# Patient Record
Sex: Male | Born: 1975 | Race: White | Hispanic: No | Marital: Married | Smoking: Never smoker
Health system: Southern US, Community
[De-identification: ages and names within clinical notes are randomized; demographics above are authoritative.]

## PROBLEM LIST (undated history)

## (undated) DIAGNOSIS — R42 Dizziness and giddiness: Secondary | ICD-10-CM

## (undated) DIAGNOSIS — E785 Hyperlipidemia, unspecified: Secondary | ICD-10-CM

## (undated) DIAGNOSIS — I10 Essential (primary) hypertension: Secondary | ICD-10-CM

## (undated) DIAGNOSIS — R079 Chest pain, unspecified: Secondary | ICD-10-CM

## (undated) DIAGNOSIS — I493 Ventricular premature depolarization: Secondary | ICD-10-CM

## (undated) DIAGNOSIS — R002 Palpitations: Secondary | ICD-10-CM

## (undated) HISTORY — DX: Chest pain, unspecified: R07.9

## (undated) HISTORY — DX: Hyperlipidemia, unspecified: E78.5

## (undated) HISTORY — DX: Dizziness and giddiness: R42

## (undated) HISTORY — DX: Palpitations: R00.2

## (undated) HISTORY — DX: Ventricular premature depolarization: I49.3

## (undated) HISTORY — DX: Essential (primary) hypertension: I10

---

## 2015-07-17 ENCOUNTER — Ambulatory Visit: Payer: Self-pay | Admitting: Physician Assistant

## 2015-08-12 DIAGNOSIS — R42 Dizziness and giddiness: Secondary | ICD-10-CM

## 2015-08-12 DIAGNOSIS — R079 Chest pain, unspecified: Secondary | ICD-10-CM

## 2015-08-12 DIAGNOSIS — E785 Hyperlipidemia, unspecified: Secondary | ICD-10-CM

## 2015-08-12 DIAGNOSIS — I493 Ventricular premature depolarization: Secondary | ICD-10-CM

## 2015-08-12 DIAGNOSIS — R002 Palpitations: Secondary | ICD-10-CM

## 2015-08-12 DIAGNOSIS — I10 Essential (primary) hypertension: Secondary | ICD-10-CM

## 2015-08-12 HISTORY — DX: Essential (primary) hypertension: I10

## 2015-08-12 HISTORY — DX: Ventricular premature depolarization: I49.3

## 2015-08-12 HISTORY — DX: Palpitations: R00.2

## 2015-08-12 HISTORY — DX: Dizziness and giddiness: R42

## 2015-08-13 ENCOUNTER — Encounter: Payer: Self-pay | Admitting: Cardiovascular Disease

## 2015-08-13 NOTE — Progress Notes (Signed)
Patient ID: Jordan Colon, male   DOB: 03-16-1976, 39 y.o.   MRN: [id number]     Cardiology Office Note   Date:  08/15/2015   ID:  Jordan Colon, DOB Dec 04, 1975, MRN [id number]  PCP:  Lindwood Qua, MD  Cardiologist:   Charlton Haws, MD   No chief complaint on file.     History of Present Illness: Jordan Colon is a 39 y.o. male who presents for risk factor modification.  Reviewed over 50 pages of records from [facility].  Dr Rhoderick Moody.  Family history of premature CAD with father having MI age 54.  Rx for HTN  And elevated lipids.  History of abnormal ECG with normal stress echo in 2007.  Last seen in [facility] 02/21/14.  Nonsmoker non diabetic.  History of palpitations benign responded to lower caffeine and beta blocker.  He is moderately overweight at 250 lbs  ECG 02/21/14 reviewed and nonspecific inferolateral ST segment changes. Holter in 2015 wit PVC;s no NSVT.      Past Medical History  Diagnosis Date  . Hypertension   . Hyperlipidemia   . Chest pain   . Essential hypertension 08/12/2015  . Palpitations 08/12/2015  . Dizziness 08/12/2015  . PVC's (premature ventricular contractions) 08/12/2015    History reviewed. No pertinent past surgical history.   Current Outpatient Prescriptions  Medication Sig Dispense Refill  . carvedilol (COREG) 12.5 MG tablet Take 12.5 mg by mouth daily.    Marland Kitchen CETIRIZINE HCL PO Take 1 tablet by mouth daily.    Marland Kitchen losartan (COZAAR) 100 MG tablet Take 100 mg by mouth daily.    . OMEGA-3 FATTY ACIDS-VITAMIN E PO Take 1,000 mg by mouth daily.    . pravastatin (PRAVACHOL) 40 MG tablet Take 40 mg by mouth daily.     No current facility-administered medications for this visit.    Allergies:   Review of patient's allergies indicates no known allergies.    Social History:  The patient  reports that he has never smoked. He has never used smokeless tobacco. He reports that he does not drink alcohol or use illicit drugs.   Family History:  The patient's  family history includes Coronary artery disease in his father; Heart attack in his paternal grandmother; Heart attack (age of onset: 63) in his father; Heart failure in his paternal grandfather and paternal grandmother; Hyperlipidemia in his father; Hypertension in his brother and father; Leukemia in his paternal grandmother.    ROS:  Please see the history of present illness.   Otherwise, review of systems are positive for none.   All other systems are reviewed and negative.    PHYSICAL EXAM: VS:  There were no vitals taken for this visit. , BMI There is no height or weight on file to calculate BMI. Affect appropriate Healthy:  appears stated age HEENT: normal Neck supple with no adenopathy JVP normal no bruits no thyromegaly Lungs clear with no wheezing and good diaphragmatic motion Heart:  S1/S2 no murmur, no rub, gallop or click PMI normal Abdomen: benighn, BS positve, no tenderness, no AAA no bruit.  No HSM or HJR Distal pulses intact with no bruits No edema Neuro non-focal Skin warm and dry No muscular weakness     EKG:  02/21/14  SR nonspecific inferolateral ST segment changes  08/15/15  SR rate 52 nonspecific lateral T wave changes    Recent Labs: No results found for requested labs within last 365 days.    Lipid Panel No results found for: CHOL, TRIG,  HDL, CHOLHDL, VLDL, LDLCALC, LDLDIRECT    Wt Readings from Last 3 Encounters:  No data found for Wt      Other studies Reviewed: Additional studies/ records that were reviewed today include:  Health Screening [employer].    ASSESSMENT AND PLAN:  1.  Chest Pain: resolved positive family hisotry I take care of his dad [name].  F/U calcium score  ECG no change normal stress echo in 2007 2. Palpitations/PVC;s resolved on beta blocker  3. ZOX:WRUE controlled.  Continue current medications and low sodium Dash type diet.   4. Chol: Reviewed labs from health screening done 07/2015 [employer]  LDL 52 TC 153   5. Family  history CAD  Calcium score today    Current medicines are reviewed at length with the patient today.  The patient does not have concerns regarding medicines.  The following changes have been made:  no change  Labs/ tests ordered today include:  Calcium Score   No orders of the defined types were placed in this encounter.     Disposition:   FU with me in a year      Signed, Charlton Haws, MD  08/15/2015 10:11 AM    [facility] [address] [place], [place], [place]  [address] Phone: [phone]; Fax: [phone]

## 2015-08-15 ENCOUNTER — Ambulatory Visit (INDEPENDENT_AMBULATORY_CARE_PROVIDER_SITE_OTHER): Payer: 59 | Admitting: Cardiovascular Disease

## 2015-08-15 ENCOUNTER — Ambulatory Visit (INDEPENDENT_AMBULATORY_CARE_PROVIDER_SITE_OTHER)
Admission: RE | Admit: 2015-08-15 | Discharge: 2015-08-15 | Disposition: A | Discharge status: Home / Self Care | Payer: Self-pay | Source: Ambulatory Visit | Attending: Cardiovascular Disease | Admitting: Cardiovascular Disease

## 2015-08-15 ENCOUNTER — Encounter: Payer: Self-pay | Admitting: Cardiovascular Disease

## 2015-08-15 VITALS — BP 120/76 | HR 52 | Ht 72.0 in | Wt 245.1 lb

## 2015-08-15 DIAGNOSIS — Z0000 Encounter for general adult medical examination without abnormal findings: Secondary | ICD-10-CM

## 2015-08-15 MED ORDER — CARVEDILOL 12.5 MG PO TABS: 12.5000 mg | ORAL_TABLET | Freq: Every day | ORAL | Status: DC

## 2015-08-15 MED ORDER — LOSARTAN POTASSIUM 100 MG PO TABS: 100.0000 mg | ORAL_TABLET | Freq: Every day | ORAL | Status: DC

## 2015-08-15 NOTE — Addendum Note (Signed)
Addended by: Scherrie Bateman E on: 08/15/2015 11:52 AM   Modules accepted: Orders

## 2015-08-15 NOTE — Patient Instructions (Addendum)
Medication Instructions:  Your physician recommends that you continue on your current medications as directed. Please refer to the Current Medication list given to you today.   Labwork: NONE Testing/Procedures:  CALCIUM SCORE OUT OF POCKET   $150.00 Follow-Up: Your physician wants you to follow-up in: YEAR WITH DR  Haywood Filler will receive a reminder letter in the mail two months in advance. If you don't receive a letter, please call our office to schedule the follow-up appointment.  Any Other Special Instructions Will Be Listed Below (If Applicable).

## 2015-08-19 ENCOUNTER — Other Ambulatory Visit: Payer: Self-pay | Admitting: Cardiovascular Disease

## 2015-08-29 ENCOUNTER — Other Ambulatory Visit: Payer: Self-pay

## 2015-08-29 MED ORDER — PRAVASTATIN SODIUM 40 MG PO TABS: 40.0000 mg | ORAL_TABLET | Freq: Every day | ORAL | Status: DC

## 2015-08-29 NOTE — Telephone Encounter (Signed)
Jordan StadePeter C Nishan, MD at 08/13/2015 2:48 PM  pravastatin (PRAVACHOL) 40 MG tabletTake 40 mg by mouth daily Current medicines are reviewed at length with the patient today. The patient does not have concerns regarding medicines. The following changes have been made: no change

## 2015-11-23 ENCOUNTER — Other Ambulatory Visit: Payer: Self-pay | Admitting: Cardiovascular Disease

## 2016-03-28 IMAGING — CT CT HEART SCORING
1 of 3 series · 10 of 20 positions shown, 13 images · non-contrast
Comparison: none

ADDENDUM:
Mediastinum: The visualized portions of the trachea and lower airway
appear patent. The visualized portions of the esophagus appear
normal. No adenopathy identified.

Lungs/pleura: No pleural fluid identified. No airspace consolidation
or atelectasis. There is no suspicious pulmonary nodule or mass
identified.
Upper abdomen: The visualized portions of the liver and spleen are
unremarkable. The visualized portions of the esophagus are negative.
Bones:  No focal bone abnormality identified.
CLINICAL DATA: Risk stratification
EXAM:
Coronary Calcium Score
TECHNIQUE: The patient was scanned on a Siemens Sensation 16 slice scanner.
Axial non-contrast 3mm slices were carried out through the heart.
The data set was analyzed on a dedicated work station and scored
using the Agatson method.

[Series 6: st thins for reformat · axial · 0.77mm/px · z∈[-223,-129]mm · 10 of 116 slices shown, 13 images]
[im 11/116  vessel]
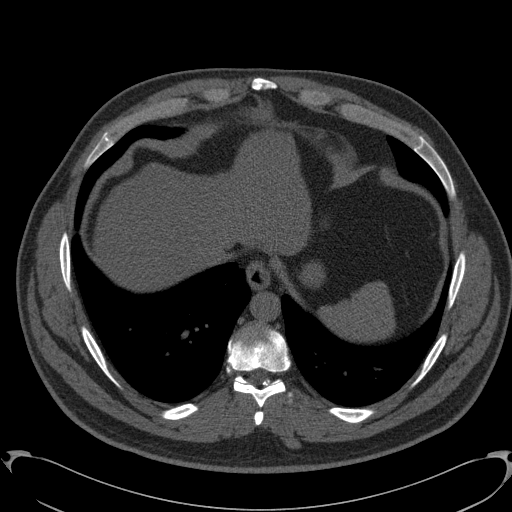
[im 11/116  lung]
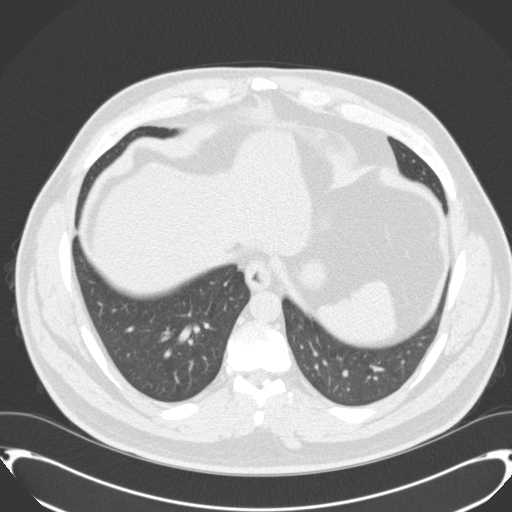
[im 21/116  vessel]
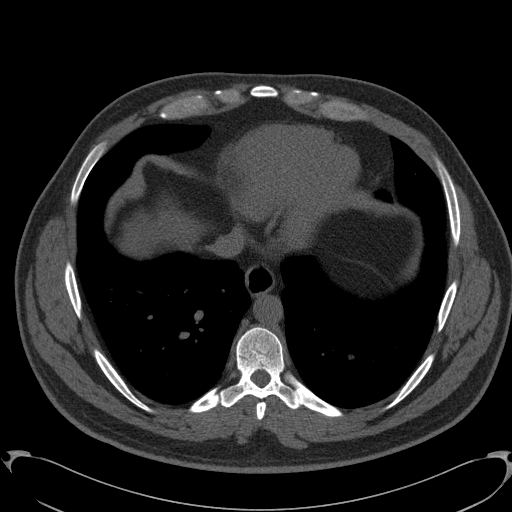
[im 32/116  vessel]
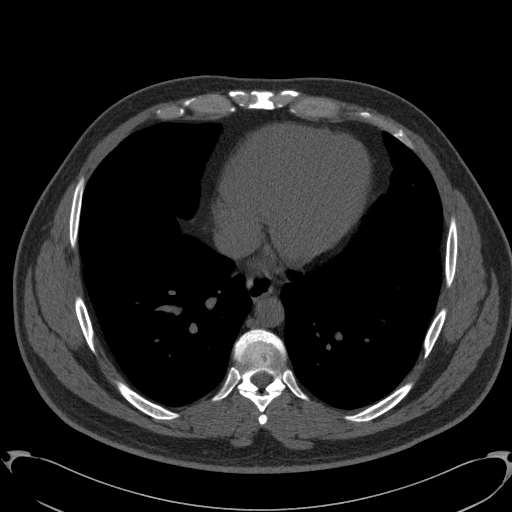
[im 42/116  vessel]
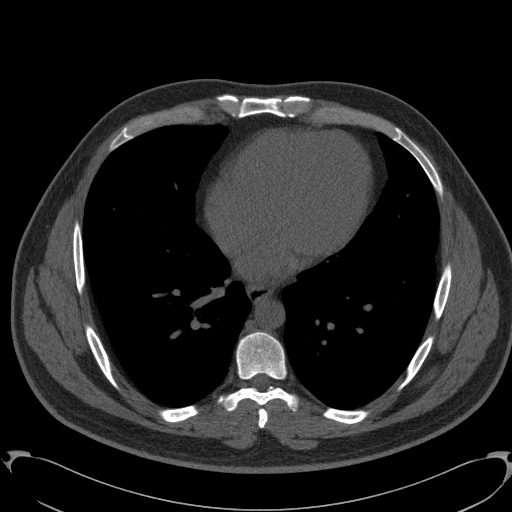
[im 53/116  vessel]
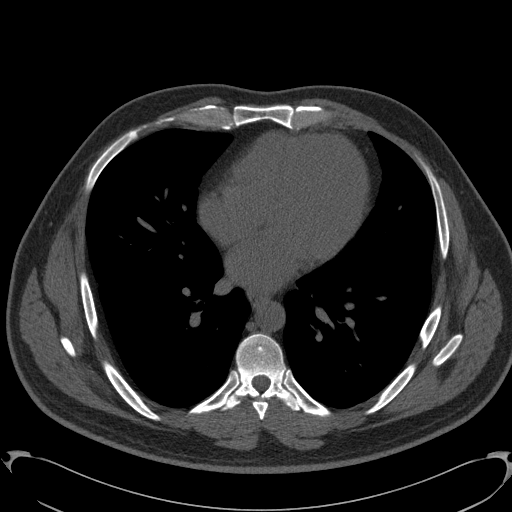
[im 53/116  lung]
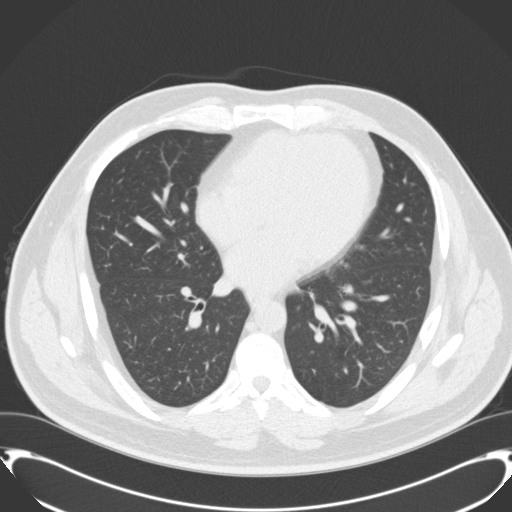
[im 63/116  vessel]
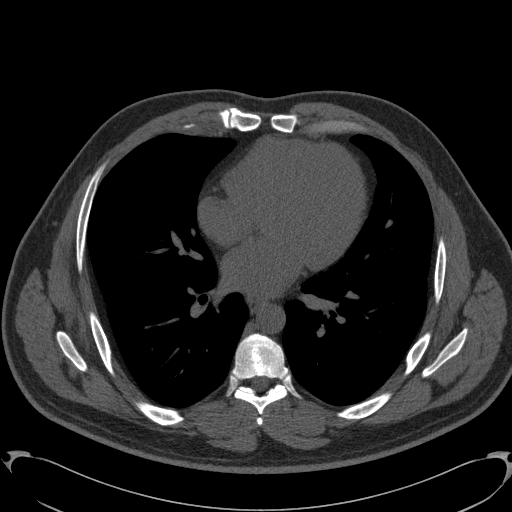
[im 74/116  vessel]
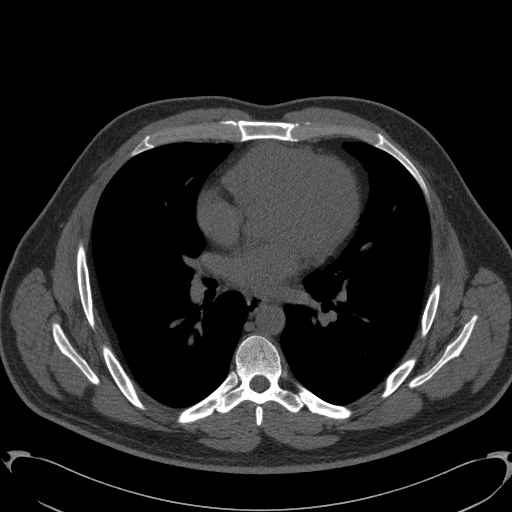
[im 84/116  vessel]
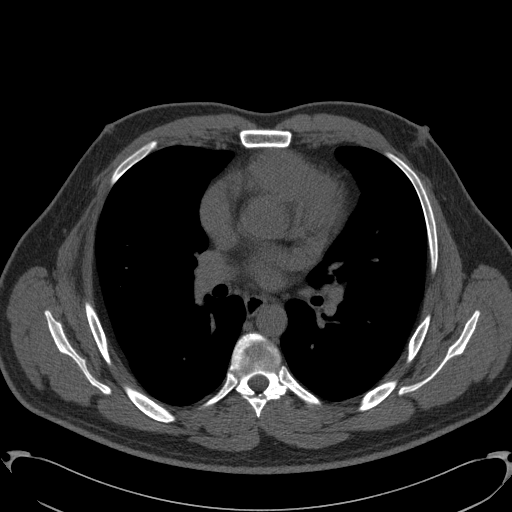
[im 95/116  vessel]
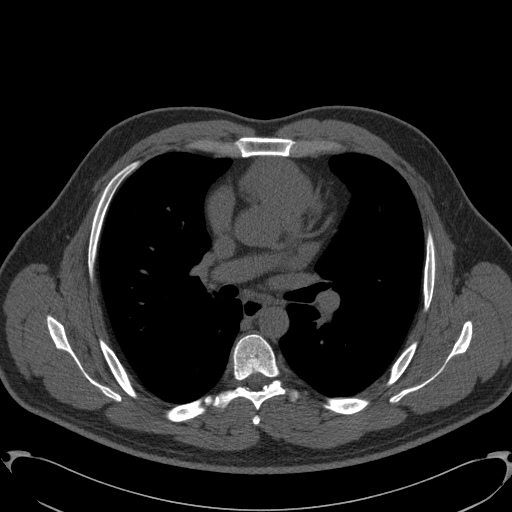
[im 95/116  lung]
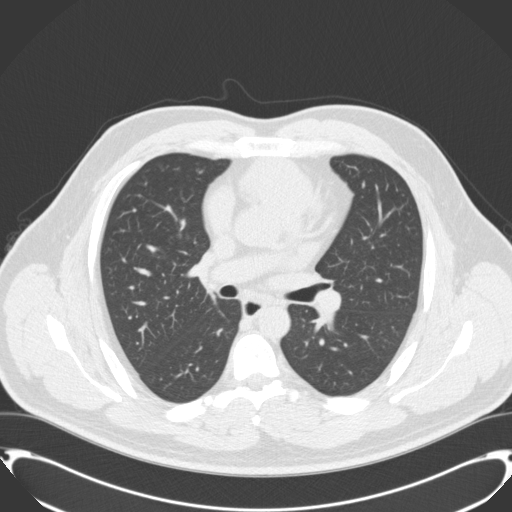
[im 105/116  vessel]
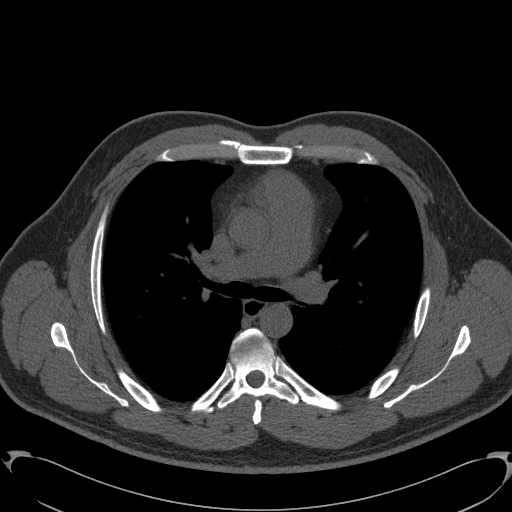

[10 of 20 positions shown; findings below may reference images not displayed]

IMPRESSION: No active pulmonary abnormalities identified. Unremarkable over-read
of non cardiac portion of CT heart scoring exam.
FINDINGS: Non-cardiac: No significant non cardiac findings on limited lung and
soft tissue windows. See separate report from [REDACTED].

Ascending Aorta:  3.4 cm

Pericardium: Normal

Coronary arteries: 0
IMPRESSION: Coronary calcium score of 0.

Dauran Saabedra

## 2016-09-19 ENCOUNTER — Other Ambulatory Visit: Payer: Self-pay | Admitting: Cardiovascular Disease

## 2016-10-20 ENCOUNTER — Other Ambulatory Visit: Payer: Self-pay | Admitting: Cardiovascular Disease

## 2016-11-13 ENCOUNTER — Other Ambulatory Visit: Payer: Self-pay | Admitting: Cardiovascular Disease

## 2016-12-10 ENCOUNTER — Other Ambulatory Visit: Payer: Self-pay

## 2016-12-14 ENCOUNTER — Telehealth: Payer: Self-pay | Admitting: *Deleted

## 2016-12-14 MED ORDER — LOSARTAN POTASSIUM 100 MG PO TABS: 100.0000 mg | ORAL_TABLET | Freq: Every day | ORAL | 0 refills | Status: DC

## 2016-12-14 MED ORDER — CARVEDILOL 12.5 MG PO TABS
12.5000 mg | ORAL_TABLET | Freq: Two times a day (BID) | ORAL | 0 refills | Status: DC
Start: 2016-12-14 — End: 2017-01-12

## 2016-12-14 NOTE — Telephone Encounter (Signed)
SWITCHED PT TO 90 DAY SO THAT INSURANCE WILL COVER SO PT DOESN'T HAVE TO PAY OUT OF POCKET.

## 2016-12-18 ENCOUNTER — Other Ambulatory Visit: Payer: Self-pay | Admitting: Cardiovascular Disease

## 2017-01-01 NOTE — Progress Notes (Signed)
Patient ID: Jordan HunterKevin Shutes, male   DOB: 1976-08-05, 41 y.o.   MRN: [id number]     Cardiology Office Note   Date:  01/12/2017   ID:  Jordan HunterKevin Eickholt, DOB 1976-08-05, MRN [id number]  PCP:  Lindwood QuaHOFFMAN,BYRON, MD  Cardiologist:   Charlton HawsPeter Lorita Forinash, MD   Chief Complaint  Patient presents with  . PVC  . Routine Adult Health Maintenance      History of Present Illness: Jordan Colon is a 41 y.o. male who presents for risk factor modification.  [facility] over [facility]  Dr Rhoderick MoodyPriyavadan.  Family history of premature CAD with father having MI age 40.  Rx for HTN  And elevated lipids.  History of abnormal ECG with normal stress echo in 2007.  [facility].  Nonsmoker non diabetic.  History of palpitations benign responded to lower caffeine and beta blocker.  He is moderately overweight at 250 lbs  ECG 02/21/14 reviewed and nonspecific inferolateral ST segment changes. Holter in 2015 wit PVC;s no NSVT.  Calcium Score done 08/12/15 was 0   No complaints New job with [employer] so no shift work Will be doing 4 - 10 hr days  Has 3 kids at home ages 447-14.  I take care of his dad Leavy CellaBoyd    Past Medical History:  Diagnosis Date  . Chest pain   . Dizziness 08/12/2015  . Essential hypertension 08/12/2015  . Hyperlipidemia   . Hypertension   . Palpitations 08/12/2015  . PVC's (premature ventricular contractions) 08/12/2015    History reviewed. No pertinent surgical history.   Current Outpatient Prescriptions  Medication Sig Dispense Refill  . carvedilol (COREG) 12.5 MG tablet Take 12.5 mg by mouth daily.    Marland Kitchen. losartan (COZAAR) 100 MG tablet Take 1 tablet (100 mg total) by mouth daily. 90 tablet 3  . Multiple Vitamin (MULTIVITAMIN) capsule Take 1 capsule by mouth daily.    . OMEGA-3 FATTY ACIDS-VITAMIN E PO Take 1,000 mg by mouth daily.    . pravastatin (PRAVACHOL) 40 MG tablet Take 1 tablet (40 mg total) by mouth daily. 90 tablet 3   No current facility-administered  medications for this visit.     Allergies:   Patient has no known allergies.    Social History:  The patient  reports that he has never smoked. He has never used smokeless tobacco. He reports that he does not drink alcohol or use drugs.   Family History:  The patient's family history includes Coronary artery disease in his father; Heart attack in his paternal grandmother; Heart attack (age of onset: 3445) in his father; Heart failure in his paternal grandfather and paternal grandmother; Hyperlipidemia in his father; Hypertension in his brother and father; Leukemia in his paternal grandmother.    ROS:  Please see the history of present illness.   Otherwise, review of systems are positive for none.   All other systems are reviewed and negative.    PHYSICAL EXAM: VS:  BP 124/86   Pulse (!) 52   Ht 6' (1.829 m)   Wt 243 lb (110.2 kg)   SpO2 98%   BMI 32.96 kg/m  , BMI Body mass index is 32.96 kg/m. Affect appropriate Healthy:  appears stated age HEENT: normal Neck supple with no adenopathy JVP normal no bruits no thyromegaly Lungs clear with no wheezing and good diaphragmatic motion Heart:  S1/S2 no murmur, no rub, gallop or click PMI normal Abdomen: benighn, BS positve, no tenderness, no AAA  no bruit.  No HSM or HJR Distal pulses intact with no bruits No edema Neuro non-focal Skin warm and dry No muscular weakness     EKG:  02/21/14  SR nonspecific inferolateral ST segment changes  08/15/15  SR rate 52 nonspecific lateral T wave changes  01/12/17  SR rate 46 nonspecific ST changes    Recent Labs: No results found for requested labs within last 8760 hours.    Lipid Panel No results found for: CHOL, TRIG, HDL, CHOLHDL, VLDL, LDLCALC, LDLDIRECT    Wt Readings from Last 3 Encounters:  01/12/17 243 lb (110.2 kg)  08/15/15 245 lb 1.9 oz (111.2 kg)      Other studies Reviewed: Additional studies/ records that were reviewed today include:  Health Screening [employer].    ASSESSMENT AND PLAN:  1.  Chest Pain: resolved positive family hisotry I take care of his dad [name].  Calcium score 0  ECG no change normal stress echo in 2007 2. Palpitations/PVC;s resolved on beta blocker but more likely all caffeine he was one wants To try to come off it ? Makes him sleepy will try as pulse is low  3. ZOX:WRUE controlled.  Continue current medications and low sodium Dash type diet.   4. Chol: Reviewed labs from health screening done 07/2015 [employer]  LDL 52 TC 153   Will draw labs today  5. Family history CAD  Calcium score  0 repeat in 2019   Current medicines are reviewed at length with the patient today.  The patient does not have concerns regarding medicines.  The following changes have been made:  D/C coreg  Labs/ tests ordered today include:     CMP and Lipid   Orders Placed This Encounter  Procedures  . Comprehensive metabolic panel  . Lipid panel  . EKG 12-Lead     Disposition:   FU with me in a year      Signed, Charlton Haws, MD  01/12/2017 9:28 AM    [facility] [address] [place], [place], [place]  [address] Phone: [phone]; Fax: [phone]

## 2017-01-12 ENCOUNTER — Encounter (INDEPENDENT_AMBULATORY_CARE_PROVIDER_SITE_OTHER): Payer: Self-pay

## 2017-01-12 ENCOUNTER — Ambulatory Visit (INDEPENDENT_AMBULATORY_CARE_PROVIDER_SITE_OTHER): Payer: 59 | Admitting: Cardiovascular Disease

## 2017-01-12 ENCOUNTER — Telehealth: Payer: Self-pay

## 2017-01-12 ENCOUNTER — Encounter: Payer: Self-pay | Admitting: Cardiovascular Disease

## 2017-01-12 VITALS — BP 124/86 | HR 52 | Ht 72.0 in | Wt 243.0 lb

## 2017-01-12 DIAGNOSIS — Z79899 Other long term (current) drug therapy: Secondary | ICD-10-CM | POA: Diagnosis not present

## 2017-01-12 DIAGNOSIS — I10 Essential (primary) hypertension: Secondary | ICD-10-CM

## 2017-01-12 DIAGNOSIS — Z0000 Encounter for general adult medical examination without abnormal findings: Secondary | ICD-10-CM

## 2017-01-12 DIAGNOSIS — I493 Ventricular premature depolarization: Secondary | ICD-10-CM

## 2017-01-12 LAB — COMPREHENSIVE METABOLIC PANEL
ALT: 45 IU/L — AB (ref 0–44)
AST: 54 IU/L — AB (ref 0–40)
Albumin/Globulin Ratio: 1.8 (ref 1.2–2.2)
Albumin: 4.5 g/dL (ref 3.5–5.5)
Alkaline Phosphatase: 126 IU/L — ABNORMAL HIGH (ref 39–117)
BILIRUBIN TOTAL: 0.9 mg/dL (ref 0.0–1.2)
BUN/Creatinine Ratio: 11 (ref 9–20)
BUN: 11 mg/dL (ref 6–20)
CHLORIDE: 96 mmol/L (ref 96–106)
CO2: 25 mmol/L (ref 18–29)
Calcium: 9.6 mg/dL (ref 8.7–10.2)
Creatinine, Ser: 0.99 mg/dL (ref 0.76–1.27)
GFR calc non Af Amer: 96 mL/min/{1.73_m2} (ref >59)
GFR, EST AFRICAN AMERICAN: 110 mL/min/{1.73_m2} (ref >59)
GLUCOSE: 203 mg/dL — AB (ref 65–99)
Globulin, Total: 2.5 g/dL (ref 1.5–4.5)
Potassium: 4.6 mmol/L (ref 3.5–5.2)
Sodium: 138 mmol/L (ref 134–144)
TOTAL PROTEIN: 7 g/dL (ref 6.0–8.5)

## 2017-01-12 LAB — LIPID PANEL
CHOLESTEROL TOTAL: 161 mg/dL (ref 100–199)
Chol/HDL Ratio: 3 ratio units (ref 0.0–5.0)
HDL: 54 mg/dL (ref >39)
LDL Calculated: 90 mg/dL (ref 0–99)
TRIGLYCERIDES: 83 mg/dL (ref 0–149)
VLDL CHOLESTEROL CAL: 17 mg/dL (ref 5–40)

## 2017-01-12 MED ORDER — PRAVASTATIN SODIUM 40 MG PO TABS: 40.0000 mg | ORAL_TABLET | Freq: Every day | ORAL | 3 refills | Status: DC

## 2017-01-12 MED ORDER — LOSARTAN POTASSIUM 100 MG PO TABS: 100.0000 mg | ORAL_TABLET | Freq: Every day | ORAL | 3 refills | Status: DC

## 2017-01-12 NOTE — Patient Instructions (Addendum)
Medication Instructions:  Your physician has recommended you make the following change in your medication:  1-STOP Coreg   Labwork: Your physician recommends that you have lab work done today- CMET and Lipid panel  Testing/Procedures: NONE  Follow-Up: Your physician wants you to follow-up in: 12 months with Dr. Eden EmmsNishan. You will receive a reminder letter in the mail two months in advance. If you don't receive a letter, please call our office to schedule the follow-up appointment.   If you need a refill on your cardiac medications before your next appointment, please call your pharmacy.

## 2017-01-12 NOTE — Telephone Encounter (Signed)
-----   Message from Wendall StadePeter C Nishan, MD sent at 01/12/2017  3:50 PM EST ----- BS up cholesterol ok LFTls mildly elevated should be repeated in 6 months

## 2017-01-12 NOTE — Telephone Encounter (Signed)
Patient aware of results. Scheduled patient for repeat labs in August. Patient verbalized understanding.

## 2017-03-12 ENCOUNTER — Other Ambulatory Visit: Payer: Self-pay | Admitting: Cardiovascular Disease

## 2017-07-02 ENCOUNTER — Other Ambulatory Visit: Payer: 59

## 2018-01-12 ENCOUNTER — Other Ambulatory Visit: Payer: Self-pay

## 2018-01-12 DIAGNOSIS — E785 Hyperlipidemia, unspecified: Secondary | ICD-10-CM

## 2018-01-12 DIAGNOSIS — I10 Essential (primary) hypertension: Secondary | ICD-10-CM

## 2018-01-12 NOTE — Progress Notes (Signed)
Patient needed orders of lipid and liver panel. Patient's old order expired.

## 2018-01-21 ENCOUNTER — Other Ambulatory Visit: Payer: Self-pay | Admitting: Cardiovascular Disease

## 2018-01-21 ENCOUNTER — Ambulatory Visit: Payer: 59 | Admitting: Cardiovascular Disease

## 2018-02-09 ENCOUNTER — Other Ambulatory Visit: Payer: 59 | Admitting: *Deleted

## 2018-02-09 ENCOUNTER — Other Ambulatory Visit: Payer: Self-pay | Admitting: Cardiovascular Disease

## 2018-02-09 ENCOUNTER — Encounter (INDEPENDENT_AMBULATORY_CARE_PROVIDER_SITE_OTHER): Payer: Self-pay

## 2018-02-09 DIAGNOSIS — I10 Essential (primary) hypertension: Secondary | ICD-10-CM

## 2018-02-09 DIAGNOSIS — E785 Hyperlipidemia, unspecified: Secondary | ICD-10-CM

## 2018-02-09 LAB — LIPID PANEL
CHOLESTEROL TOTAL: 130 mg/dL (ref 100–199)
Chol/HDL Ratio: 2.9 ratio (ref 0.0–5.0)
HDL: 45 mg/dL (ref >39)
LDL Calculated: 76 mg/dL (ref 0–99)
Triglycerides: 47 mg/dL (ref 0–149)
VLDL Cholesterol Cal: 9 mg/dL (ref 5–40)

## 2018-02-09 LAB — HEPATIC FUNCTION PANEL
ALBUMIN: 4.4 g/dL (ref 3.5–5.5)
ALK PHOS: 89 IU/L (ref 39–117)
ALT: 18 IU/L (ref 0–44)
AST: 26 IU/L (ref 0–40)
BILIRUBIN TOTAL: 0.8 mg/dL (ref 0.0–1.2)
BILIRUBIN, DIRECT: 0.21 mg/dL (ref 0.00–0.40)
TOTAL PROTEIN: 6.4 g/dL (ref 6.0–8.5)

## 2018-02-09 NOTE — Progress Notes (Signed)
Patient ID: Jordan Colon, male   DOB: April 20, 1976, 42 y.o.   MRN: [id number]     Cardiology Office Note   Date:  02/15/2018   ID:  Jordan Colon, DOB [birth year], MRN [id number]  PCP:  Lindwood Qua, MD  Cardiologist:   Charlton Haws, MD   No chief complaint on file.     History of Present Illness:  87 y.o. son of one of my patients [name] who has severe premature CAD and CABG. CRF; also include HTN, HLD. Non smoker non diabetic. Previously on beta blocker for palpitations but stopped due to fatigue. Palpitations better with less caffeine ECG has chronic nonspecific ST chagnes Calcium score 0 September 2016  No complaints New job with [employer] so no shift work Will be doing 4 - 10 hr days  Has 3 kids at home ages 7-15.     Labs reviewed from 02/09/18 LDL 76 with normal LFTls   BS may be high needs A1c Doing better with diet now   Past Medical History:  Diagnosis Date  . Chest pain   . Dizziness 08/12/2015  . Essential hypertension 08/12/2015  . Hyperlipidemia   . Hypertension   . Palpitations 08/12/2015  . PVC's (premature ventricular contractions) 08/12/2015    History reviewed. No pertinent surgical history.   Current Outpatient Medications  Medication Sig Dispense Refill  . carvedilol (COREG) 12.5 MG tablet Take 12.5 mg by mouth once a week. Per patient takes once a week    . Multiple Vitamin (MULTIVITAMIN) capsule Take 1 capsule by mouth daily.    . Omega-3 Fatty Acids (FISH OIL) 1200 MG CAPS Take 1 capsule by mouth daily.    . pravastatin (PRAVACHOL) 40 MG tablet Take 40 mg by mouth as directed. Per patient takes 1 tab po every 2-3 days    . losartan (COZAAR) 100 MG tablet Take 1 tablet (100 mg total) by mouth daily. 90 tablet 3   No current facility-administered medications for this visit.     Allergies:   Patient has no known allergies.    Social History:  The patient  reports that he has never smoked. He has never used smokeless tobacco. He reports that he does  not drink alcohol or use drugs.   Family History:  The patient's family history includes Coronary artery disease in his father; Heart attack in his paternal grandmother; Heart attack (age of onset: 64) in his father; Heart failure in his paternal grandfather and paternal grandmother; Hyperlipidemia in his father; Hypertension in his brother and father; Leukemia in his paternal grandmother.    ROS:  Please see the history of present illness.   Otherwise, review of systems are positive for none.   All other systems are reviewed and negative.    PHYSICAL EXAM: VS:  BP 110/72   Pulse (!) 46   Ht 6' (1.829 m)   Wt 235 lb 8 oz (106.8 kg)   SpO2 97%   BMI 31.94 kg/m  , BMI Body mass index is 31.94 kg/m. Affect appropriate Healthy:  appears stated age HEENT: normal Neck supple with no adenopathy JVP normal no bruits no thyromegaly Lungs clear with no wheezing and good diaphragmatic motion Heart:  S1/S2 no murmur, no rub, gallop or click PMI normal Abdomen: benighn, BS positve, no tenderness, no AAA no bruit.  No HSM or HJR Distal pulses intact with no bruits No edema Neuro non-focal Skin warm and dry No muscular weakness      EKG:  02/21/14  SR nonspecific inferolateral ST segment changes  08/15/15  SR rate 52 nonspecific lateral T wave changes  01/12/17  SR rate 46 nonspecific ST changes    Recent Labs: 02/09/2018: ALT 18    Lipid Panel    Component Value Date/Time   CHOL 130 02/09/2018 0849   TRIG 47 02/09/2018 0849   HDL 45 02/09/2018 0849   CHOLHDL 2.9 02/09/2018 0849   LDLCALC 76 02/09/2018 0849      Wt Readings from Last 3 Encounters:  02/15/18 235 lb 8 oz (106.8 kg)  01/12/17 243 lb (110.2 kg)  08/15/15 245 lb 1.9 oz (111.2 kg)      Other studies Reviewed: Additional studies/ records that were reviewed today include:  Health Screening [employer]. Calcium score 2016 stress echo 2007 normal    ASSESSMENT AND PLAN:  1.  Chest Pain: resolved positive family  hisotry I take care of his dad Leavy CellaBoyd.  Calcium score 0  ECG no change normal stress echo in 2007 2. Palpitations resolved HR low ok to stop beta blocker   3. KGM:WNUUHTN:Well controlled.  Continue current medications and low sodium Dash type diet.   4. Chol: reviewed labs done 02/09/18 LDL 76 normal LFTls  5. Family history CAD  Calcium score  0 08/15/15 repeat in September  2019 6. BS:  Will check A1c today discussed low carb diet    Charlton HawsPeter Nishan

## 2018-02-15 ENCOUNTER — Ambulatory Visit (INDEPENDENT_AMBULATORY_CARE_PROVIDER_SITE_OTHER): Payer: 59 | Admitting: Cardiovascular Disease

## 2018-02-15 ENCOUNTER — Encounter: Payer: Self-pay | Admitting: Cardiovascular Disease

## 2018-02-15 VITALS — BP 110/72 | HR 46 | Ht 72.0 in | Wt 235.5 lb

## 2018-02-15 DIAGNOSIS — I493 Ventricular premature depolarization: Secondary | ICD-10-CM | POA: Diagnosis not present

## 2018-02-15 DIAGNOSIS — E785 Hyperlipidemia, unspecified: Secondary | ICD-10-CM | POA: Diagnosis not present

## 2018-02-15 DIAGNOSIS — I10 Essential (primary) hypertension: Secondary | ICD-10-CM

## 2018-02-15 MED ORDER — LOSARTAN POTASSIUM 100 MG PO TABS: 100.0000 mg | ORAL_TABLET | Freq: Every day | ORAL | 3 refills | Status: DC

## 2018-02-15 NOTE — Patient Instructions (Addendum)
Medication Instructions:  Your physician recommends that you continue on your current medications as directed. Please refer to the Current Medication list given to you today.  Labwork: Your physician recommends that you have lab work today. HgbA1c  Testing/Procedures: NONE  Follow-Up: Your physician wants you to follow-up in: 12 months with Dr. Nishan. You will receive a reminder letter in the mail two months in advance. If you don't receive a letter, please call our office to schedule the follow-up appointment.   If you need a refill on your cardiac medications before your next appointment, please call your pharmacy.     

## 2018-02-16 LAB — HEMOGLOBIN A1C
Est. average glucose Bld gHb Est-mCnc: 140 mg/dL
HEMOGLOBIN A1C: 6.5 % — AB (ref 4.8–5.6)

## 2018-05-31 ENCOUNTER — Other Ambulatory Visit: Payer: Self-pay | Admitting: Cardiovascular Disease

## 2019-04-03 ENCOUNTER — Other Ambulatory Visit: Payer: Self-pay | Admitting: Cardiovascular Disease

## 2019-04-03 DIAGNOSIS — E785 Hyperlipidemia, unspecified: Secondary | ICD-10-CM

## 2019-04-03 DIAGNOSIS — I10 Essential (primary) hypertension: Secondary | ICD-10-CM

## 2019-04-03 DIAGNOSIS — I493 Ventricular premature depolarization: Secondary | ICD-10-CM

## 2020-11-25 NOTE — Progress Notes (Signed)
Patient ID: Jordan Colon, male   DOB: [birth year], 45 y.o.   MRN: [id number]     Cardiology Office Note   Date:  11/27/2020   ID:  Jordan Colon, DOB February 12, 1976, MRN [id number]  PCP:  Lindwood Qua, MD  Cardiologist:   Charlton Haws, MD   No chief complaint on file.     History of Present Illness:  34 y.o. son of one of my patients [name] who has severe premature CAD and CABG. CRF; also include HTN, HLD. Non smoker non diabetic. Previously on beta blocker for palpitations but stopped due to fatigue. Palpitations better with less caffeine ECG has chronic nonspecific ST chagnes Calcium score 0 September 2016  Still working at [employer]  Has 3 kids at home ages 44-17.    Lab review from 11/05/20 A1c 8.1 LDL 87   He has room to improved diet. 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 Chest pain   . Dizziness 08/12/2015  . Essential hypertension 08/12/2015  . Hyperlipidemia   . Hypertension   . Palpitations 08/12/2015  . PVC's (premature ventricular contractions) 08/12/2015    No past surgical history on file.   Current Outpatient Medications  Medication Sig Dispense Refill  . losartan (COZAAR) 100 MG tablet TAKE 1 TABLET BY MOUTH EVERY DAY 90 tablet 3  . metFORMIN (GLUCOPHAGE-XR) 500 MG 24 hr tablet Take 2 tablets by mouth in the morning and at bedtime.    . Multiple Vitamin (MULTIVITAMIN) capsule Take 1 capsule by mouth daily.    . Omega-3 Fatty Acids (FISH OIL) 1200 MG CAPS Take 1 capsule by mouth daily.    . pravastatin (PRAVACHOL) 40 MG tablet TAKE 1 TABLET (40 MG TOTAL) BY MOUTH DAILY. 90 tablet 3   No current facility-administered medications for this visit.    Allergies:   Patient has no known allergies.    Social History:  The patient   reports that he has never smoked. He has never used smokeless tobacco. He reports that he does not drink alcohol and does not use drugs.   Family History:  The patient's family history includes Coronary artery disease in his father; Heart attack in his paternal grandmother; Heart attack (age of onset: 53) in his father; Heart failure in his paternal grandfather and paternal grandmother; Hyperlipidemia in his father; Hypertension in his brother and father; Leukemia in his paternal grandmother.    ROS:  Please see the history of present illness.   Otherwise, review of systems are positive for none.   All other systems are reviewed and negative.    PHYSICAL EXAM: VS:  There were no vitals taken for this visit. , BMI There is no height or weight on file to calculate BMI. Affect appropriate Healthy:  appears stated age HEENT: normal Neck supple with no adenopathy JVP normal no bruits no thyromegaly Lungs clear with no wheezing and good diaphragmatic motion Heart:  S1/S2 no murmur, no rub, gallop or click PMI normal Abdomen: benighn, BS positve, no tenderness, no AAA no bruit.  No HSM or HJR Distal pulses intact with no bruits No edema Neuro non-focal Skin warm and dry No muscular weakness      EKG:  02/21/14  SR nonspecific inferolateral ST segment changes  08/15/15  SR rate 52 nonspecific lateral T wave changes  01/12/17  SR rate 46 nonspecific ST changes    Recent Labs: No results found for requested labs within last 8760 hours.    Lipid Panel    Component Value Date/Time   CHOL 130 02/09/2018 0849   TRIG 47 02/09/2018 0849   HDL 45 02/09/2018 0849   CHOLHDL 2.9 02/09/2018 0849   LDLCALC 76 02/09/2018 0849      Wt Readings from Last 3 Encounters:  02/15/18 106.8 kg  01/12/17 110.2 kg  08/15/15 111.2 kg      Other studies Reviewed: Additional studies/ records that were reviewed today include:  Health Screening [employer]. Calcium score 2016 stress echo 2007 normal     ASSESSMENT AND PLAN:  1.  Chest Pain: resolved positive family hisotry I take care of his dad [name].  Calcium score 0  ECG no change normal stress echo in 2007 2. Palpitations resolved HR low ok to stop beta blocker   3. URK:YHCW controlled.  Continue current medications and low sodium Dash type diet.   4. Chol: reviewed labs done 02/09/18 LDL 76 normal LFTls will update  5. Family history CAD  Calcium score  0 08/15/15 Recommended repeating particularly as cost has decreased to $99's  6. DM:  Continue glucophage discussed target A1c < 7 and better diet   F/u in a year   Charlton Haws

## 2020-11-27 ENCOUNTER — Ambulatory Visit (INDEPENDENT_AMBULATORY_CARE_PROVIDER_SITE_OTHER): Payer: 59 | Admitting: Cardiovascular Disease

## 2020-11-27 ENCOUNTER — Other Ambulatory Visit: Payer: Self-pay

## 2020-11-27 ENCOUNTER — Encounter: Payer: Self-pay | Admitting: Cardiovascular Disease

## 2020-11-27 VITALS — BP 136/88 | HR 99 | Ht 72.0 in | Wt 239.0 lb

## 2020-11-27 DIAGNOSIS — I493 Ventricular premature depolarization: Secondary | ICD-10-CM | POA: Diagnosis not present

## 2020-11-27 DIAGNOSIS — I10 Essential (primary) hypertension: Secondary | ICD-10-CM

## 2020-11-27 DIAGNOSIS — E782 Mixed hyperlipidemia: Secondary | ICD-10-CM

## 2020-11-27 NOTE — Patient Instructions (Signed)
